# Patient Record
Sex: Male | Born: 1990 | Race: White | Hispanic: No | Marital: Single | Smoking: Current every day smoker
Health system: Southern US, Community
[De-identification: ages and names within clinical notes are randomized; demographics above are authoritative.]

## PROBLEM LIST (undated history)

## (undated) DIAGNOSIS — L409 Psoriasis, unspecified: Secondary | ICD-10-CM

---

## 2012-11-26 ENCOUNTER — Encounter (HOSPITAL_BASED_OUTPATIENT_CLINIC_OR_DEPARTMENT_OTHER): Payer: Self-pay | Admitting: *Deleted

## 2012-11-26 ENCOUNTER — Emergency Department (HOSPITAL_BASED_OUTPATIENT_CLINIC_OR_DEPARTMENT_OTHER)
Admission: EM | Admit: 2012-11-26 | Discharge: 2012-11-27 | Disposition: A | Discharge status: Home / Self Care | Payer: Self-pay | Attending: Emergency Medicine | Admitting: Emergency Medicine

## 2012-11-26 DIAGNOSIS — L299 Pruritus, unspecified: Secondary | ICD-10-CM | POA: Insufficient documentation

## 2012-11-26 DIAGNOSIS — M545 Low back pain, unspecified: Secondary | ICD-10-CM | POA: Insufficient documentation

## 2012-11-26 DIAGNOSIS — R21 Rash and other nonspecific skin eruption: Secondary | ICD-10-CM | POA: Insufficient documentation

## 2012-11-26 DIAGNOSIS — F17200 Nicotine dependence, unspecified, uncomplicated: Secondary | ICD-10-CM | POA: Insufficient documentation

## 2012-11-26 LAB — CBC WITH DIFFERENTIAL/PLATELET
Basophils Absolute: 0 10*3/uL (ref 0.0–0.1)
Basophils Relative: 0 % (ref 0–1)
Eosinophils Absolute: 0.1 10*3/uL (ref 0.0–0.7)
MCH: 32.8 pg (ref 26.0–34.0)
MCHC: 36.9 g/dL — ABNORMAL HIGH (ref 30.0–36.0)
Neutro Abs: 4.4 10*3/uL (ref 1.7–7.7)
Neutrophils Relative %: 67 % (ref 43–77)
Platelets: 186 10*3/uL (ref 150–400)
RDW: 12.4 % (ref 11.5–15.5)

## 2012-11-26 LAB — BASIC METABOLIC PANEL
Chloride: 105 mEq/L (ref 96–112)
GFR calc Af Amer: >90 mL/min (ref >90)
GFR calc non Af Amer: >90 mL/min (ref >90)
Potassium: 3.4 mEq/L — ABNORMAL LOW (ref 3.5–5.1)

## 2012-11-26 MED ORDER — SULFAMETHOXAZOLE-TMP DS 800-160 MG PO TABS
1.0000 | ORAL_TABLET | Freq: Once | ORAL | Status: AC
  Administered 2012-11-26: 1 via ORAL
  Filled 2012-11-26: qty 1

## 2012-11-26 MED ORDER — PREDNISONE 50 MG PO TABS
60.0000 mg | ORAL_TABLET | Freq: Once | ORAL | Status: AC
  Administered 2012-11-26: 60 mg via ORAL
  Filled 2012-11-26: qty 1

## 2012-11-26 MED ORDER — DIPHENHYDRAMINE HCL 25 MG PO CAPS
25.0000 mg | ORAL_CAPSULE | Freq: Once | ORAL | Status: AC
  Administered 2012-11-26: 25 mg via ORAL
  Filled 2012-11-26: qty 1

## 2012-11-26 MED ORDER — CEPHALEXIN 250 MG PO CAPS
250.0000 mg | ORAL_CAPSULE | Freq: Once | ORAL | Status: AC
  Administered 2012-11-26: 250 mg via ORAL
  Filled 2012-11-26: qty 1

## 2012-11-26 NOTE — ED Provider Notes (Signed)
History    CSN: [id number] Arrival date & time 11/26/12  2144  First MD Initiated Contact with Patient 11/26/12 2212     Chief Complaint  Patient presents with  . Rash   (Consider location/radiation/quality/duration/timing/severity/associated sxs/prior Treatment) HPI  22 year old male presents for evaluation of a rash. Patient reports he has developed a rash for the past month. Rash was first noticed on his chest but now has spread throughout the whole entire body. Rash is itchy in nature, and is progressively worse. Tonight he while working he felt short of breath, and then decided to come to ER for further evaluation. Sts SOB lasted for 40 min but has resolved.  He denies fever, headache, neck stiffness, cp, cough, hemoptysis, abd pain, dysuria, numbness or weakness.  Does report having nausea and vomiting usually in the morning for nearly 2 weeks but that has resolved.  Also report having pain to lower back for several weeks but pain is minimal.  He took left over ibuprofen 800mg  and augmentin for the past week as well.  He denies any recent sexual contact.  No one else at home with similar sxs.  No new pets, changes in soap or detergent, no tick bite, no joint pain.  Has tried OTC cream without relief.  Has not been evaluate for this before.    History reviewed. No pertinent past medical history. History reviewed. No pertinent past surgical history. No family history on file. History  Substance Use Topics  . Smoking status: Current Every Day Smoker -- 0.50 packs/day    Types: Cigarettes  . Smokeless tobacco: Not on file  . Alcohol Use: Yes    Review of Systems  All other systems reviewed and are negative.    Allergies  Review of patient's allergies indicates no known allergies.  Home Medications  No current outpatient prescriptions on file. BP 145/83  Pulse 112  Temp(Src) 98.5 F (36.9 C) (Oral)  Resp 22  Wt 160 lb (72.576 kg)  SpO2 100% Physical Exam  Nursing note  and vitals reviewed. Constitutional: He is oriented to person, place, and time. He appears well-developed and well-nourished. No distress.  HENT:  Head: Normocephalic and atraumatic.  Significant plaque-rash throughout scalp, no alopecia.  Eyes: Conjunctivae and EOM are normal. Pupils are equal, round, and reactive to light. No scleral icterus.  Neck: Normal range of motion. Neck supple.  Cardiovascular:  Tachycardia without M/R/G  Pulmonary/Chest: Effort normal and breath sounds normal. No respiratory distress. He has no wheezes. He has no rales.  Abdominal: Soft. Bowel sounds are normal. There is no tenderness.  Neurological: He is alert and oriented to person, place, and time.  Skin: Skin is warm. Rash (blanchable erythematous plaque appearance rash throughout scalp, face, chest, back, arms, legs, and penis.  No rash on palms of hand, soles of foot, or mucosa) noted.  Psychiatric: He has a normal mood and affect.    ED Course  Procedures (including critical care time)  11:00 PM Pt with rash suggestive of psoriasis.  Doubt infectious etiology as other member at home come in contact with rash without same rash.  No red flags.  My attending has also evaluate and agrees.  Plan to refer pt to Dermatology.  Will prescribe keflex/bactrim to prevent secondary infection.  Plan do give benadryl for itch, and prednisone burst/taper.  Return precaution discussed.     Labs Reviewed  CBC WITH DIFFERENTIAL - Abnormal; Notable for the following:    MCHC 36.9 (*)  All other components within normal limits  BASIC METABOLIC PANEL - Abnormal; Notable for the following:    Potassium 3.4 (*)    Glucose, Bld 105 (*)    All other components within normal limits  RPR   No results found. 1. Rash     MDM  BP 145/83  Pulse 112  Temp(Src) 98.5 F (36.9 C) (Oral)  Resp 22  Wt 160 lb (72.576 kg)  SpO2 100%   Fayrene Helper, PA-C 11/27/12 0012

## 2012-11-26 NOTE — ED Notes (Signed)
Rash for a month. Tonight he got sob. Hot to touch per mother.

## 2012-11-26 NOTE — ED Notes (Signed)
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

## 2012-11-27 LAB — RPR: RPR Ser Ql: NONREACTIVE

## 2012-11-27 MED ORDER — PREDNISONE 20 MG PO TABS: ORAL_TABLET | ORAL | Status: AC

## 2012-11-27 MED ORDER — SULFAMETHOXAZOLE-TRIMETHOPRIM 800-160 MG PO TABS: 1.0000 | ORAL_TABLET | Freq: Two times a day (BID) | ORAL | Status: AC

## 2012-11-27 MED ORDER — CEPHALEXIN 500 MG PO CAPS: 500.0000 mg | ORAL_CAPSULE | Freq: Four times a day (QID) | ORAL | Status: AC

## 2012-11-27 MED ORDER — DIPHENHYDRAMINE HCL 25 MG PO TABS: 25.0000 mg | ORAL_TABLET | Freq: Four times a day (QID) | ORAL | Status: AC

## 2012-11-27 NOTE — ED Provider Notes (Signed)
Medical screening examination/treatment/procedure(s) were conducted as a shared visit with non-physician practitioner(s) and myself.  I personally evaluated the patient during the encounter  Pt seen and examined, mulitple erythematous pruritic lesions- pustules? Some with overlying scale.  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  Labs reassuring.  RPR sent as well.  Pt advised to f/u with dermatology- started on abx in case of MRSA.    Ethelda Chick, MD 11/27/12 1520

## 2020-06-30 ENCOUNTER — Emergency Department (HOSPITAL_COMMUNITY): Payer: Self-pay

## 2020-06-30 ENCOUNTER — Emergency Department (HOSPITAL_COMMUNITY)
Admission: EM | Admit: 2020-06-30 | Discharge: 2020-06-30 | Disposition: A | Discharge status: Home / Self Care | Payer: Self-pay | Attending: Emergency Medicine | Admitting: Emergency Medicine

## 2020-06-30 DIAGNOSIS — F17210 Nicotine dependence, cigarettes, uncomplicated: Secondary | ICD-10-CM | POA: Insufficient documentation

## 2020-06-30 DIAGNOSIS — N201 Calculus of ureter: Secondary | ICD-10-CM | POA: Insufficient documentation

## 2020-06-30 LAB — URINALYSIS, ROUTINE W REFLEX MICROSCOPIC
Bilirubin Urine: NEGATIVE
Glucose, UA: NEGATIVE mg/dL
Hgb urine dipstick: NEGATIVE
Ketones, ur: NEGATIVE mg/dL
Leukocytes,Ua: NEGATIVE
Nitrite: NEGATIVE
Protein, ur: NEGATIVE mg/dL
Specific Gravity, Urine: >1.03 — ABNORMAL HIGH (ref 1.005–1.030)
pH: 5.5 (ref 5.0–8.0)

## 2020-06-30 LAB — CBC
HCT: 42.7 % (ref 39.0–52.0)
Hemoglobin: 14.9 g/dL (ref 13.0–17.0)
MCH: 30.9 pg (ref 26.0–34.0)
MCHC: 34.9 g/dL (ref 30.0–36.0)
MCV: 88.6 fL (ref 80.0–100.0)
Platelets: 302 10*3/uL (ref 150–400)
RBC: 4.82 MIL/uL (ref 4.22–5.81)
RDW: 13.8 % (ref 11.5–15.5)
WBC: 6.8 10*3/uL (ref 4.0–10.5)
nRBC: 0 % (ref 0.0–0.2)

## 2020-06-30 LAB — BASIC METABOLIC PANEL
Anion gap: 11 (ref 5–15)
BUN: 9 mg/dL (ref 6–20)
CO2: 22 mmol/L (ref 22–32)
Calcium: 9.2 mg/dL (ref 8.9–10.3)
Chloride: 106 mmol/L (ref 98–111)
Creatinine, Ser: 0.82 mg/dL (ref 0.61–1.24)
GFR, Estimated: >60 mL/min (ref >60)
Glucose, Bld: 93 mg/dL (ref 70–99)
Potassium: 3.9 mmol/L (ref 3.5–5.1)
Sodium: 139 mmol/L (ref 135–145)

## 2020-06-30 MED ORDER — HYDROCODONE-ACETAMINOPHEN 5-325 MG PO TABS: 1.0000 | ORAL_TABLET | Freq: Four times a day (QID) | ORAL | 0 refills | Status: AC | PRN

## 2020-06-30 MED ORDER — ONDANSETRON HCL 4 MG PO TABS: 4.0000 mg | ORAL_TABLET | Freq: Four times a day (QID) | ORAL | 0 refills | Status: AC

## 2020-06-30 MED ORDER — IBUPROFEN 600 MG PO TABS: 600.0000 mg | ORAL_TABLET | Freq: Four times a day (QID) | ORAL | 0 refills | Status: AC | PRN

## 2020-06-30 MED ORDER — OXYCODONE-ACETAMINOPHEN 5-325 MG PO TABS
1.0000 | ORAL_TABLET | ORAL | Status: DC | PRN
  Administered 2020-06-30: 1 via ORAL
  Filled 2020-06-30: qty 1

## 2020-06-30 MED ORDER — TAMSULOSIN HCL 0.4 MG PO CAPS: 0.4000 mg | ORAL_CAPSULE | Freq: Every day | ORAL | 0 refills | Status: AC

## 2020-06-30 NOTE — ED Provider Notes (Signed)
[facility] EMERGENCY DEPARTMENT Provider Note   CSN: [id number] Arrival date & time: 06/30/20  3810     History Chief Complaint  Patient presents with  . Flank Pain    Ruben Klein is a 30 y.o. male.  HPI   30 year old male presenting the emergency department today for evaluation of right flank pain. States it started earlier this morning. Pain was severe in nature and was constant. He reports some associated pressure with urination for the past 1 to 2 days. He denies any dysuria or hematuria. Denies any associated fevers nausea, vomiting, diarrhea or constipation. States he was given medication while in the waiting room and his pain is now about a 1/10.  No past medical history on file.  There are no problems to display for this patient.   No past surgical history on file.     No family history on file.  Social History   Tobacco Use  . Smoking status: Current Every Day Smoker    Packs/day: 0.50    Types: Cigarettes  Substance Use Topics  . Alcohol use: Yes  . Drug use: No    Home Medications Prior to Admission medications   Medication Sig Start Date End Date Taking? Authorizing Provider  HYDROcodone-acetaminophen (NORCO/VICODIN) 5-325 MG tablet Take 1 tablet by mouth every 6 (six) hours as needed. 06/30/20  Yes Farouk Vivero S, PA-C  ibuprofen (ADVIL) 600 MG tablet Take 1 tablet (600 mg total) by mouth every 6 (six) hours as needed. 06/30/20  Yes Jenelle Drennon S, PA-C  ondansetron (ZOFRAN) 4 MG tablet Take 1 tablet (4 mg total) by mouth every 6 (six) hours. 06/30/20  Yes Vika Buske S, PA-C  tamsulosin (FLOMAX) 0.4 MG CAPS capsule Take 1 capsule (0.4 mg total) by mouth daily. 06/30/20 07/30/20 Yes Bristol Osentoski S, PA-C  cephALEXin (KEFLEX) 500 MG capsule Take 1 capsule (500 mg total) by mouth 4 (four) times daily. 11/27/12   Fayrene Helper, PA-C  diphenhydrAMINE (BENADRYL) 25 MG tablet Take 1 tablet (25 mg total) by mouth every 6 (six) hours. 11/27/12    Fayrene Helper, PA-C  predniSONE (DELTASONE) 20 MG tablet 3 Tabs PO Days 1-3, then 2 tabs PO Days 4-6, then 1 tab PO Day 7-9, then Half Tab PO Day 10-12 11/27/12   Fayrene Helper, PA-C  sulfamethoxazole-trimethoprim (SEPTRA DS) 800-160 MG per tablet Take 1 tablet by mouth every 12 (twelve) hours. 11/27/12   Fayrene Helper, PA-C    Allergies    Patient has no known allergies.  Review of Systems   Review of Systems  Constitutional: Negative for fever.  HENT: Negative for ear pain and sore throat.   Eyes: Negative for visual disturbance.  Respiratory: Negative for cough and shortness of breath.   Cardiovascular: Negative for chest pain.  Gastrointestinal: Negative for abdominal pain, constipation, diarrhea, nausea and vomiting.  Genitourinary: Positive for flank pain. Negative for dysuria, hematuria and urgency.       Pressure with urination  Musculoskeletal: Negative for back pain.  Skin: Negative for rash.  Neurological: Negative for seizures and syncope.  All other systems reviewed and are negative.   Physical Exam Updated Vital Signs BP (!) 139/92 (BP Location: Right Arm)   Pulse 76   Temp 98.4 F (36.9 C) (Oral)   Resp 18   SpO2 99%   Physical Exam Vitals and nursing note reviewed.  Constitutional:      Appearance: He is well-developed and well-nourished.  HENT:     Head:  Normocephalic and atraumatic.  Eyes:     Conjunctiva/sclera: Conjunctivae normal.  Cardiovascular:     Rate and Rhythm: Normal rate and regular rhythm.     Heart sounds: Normal heart sounds. No murmur heard.   Pulmonary:     Effort: Pulmonary effort is normal. No respiratory distress.     Breath sounds: Normal breath sounds.  Abdominal:     General: Bowel sounds are normal.     Palpations: Abdomen is soft.     Tenderness: There is no abdominal tenderness. There is right CVA tenderness (mild). There is no left CVA tenderness, guarding or rebound.  Musculoskeletal:        General: No edema.     Cervical  back: Neck supple.  Skin:    General: Skin is warm and dry.  Neurological:     Mental Status: He is alert.  Psychiatric:        Mood and Affect: Mood and affect normal.     ED Results / Procedures / Treatments   Labs (all labs ordered are listed, but only abnormal results are displayed) Labs Reviewed  URINALYSIS, ROUTINE W REFLEX MICROSCOPIC - Abnormal; Notable for the following components:      Result Value   Specific Gravity, Urine >1.030 (*)    All other components within normal limits  BASIC METABOLIC PANEL  CBC    EKG None  Radiology CT Renal Stone Study  Result Date: 06/30/2020 CLINICAL DATA:  Right-sided abdominal and flank pain since this morning EXAM: CT ABDOMEN AND PELVIS WITHOUT CONTRAST TECHNIQUE: Multidetector CT imaging of the abdomen and pelvis was performed following the standard protocol without IV contrast. COMPARISON:  None. FINDINGS: Lower chest: Unremarkable Hepatobiliary: Unremarkable Pancreas: Unremarkable Spleen: Unremarkable Adrenals/Urinary Tract: Both adrenal glands appear normal. 2 mm right UVJ stone noted on image 80 of series 3, without significant hydronephrosis or hydroureter at this time. No other urinary tract calculi are identified. Stomach/Bowel: . unremarkable.  Normal appendix. Vascular/Lymphatic: There scattered small retroperitoneal and inguinal lymph nodes which are not pathologically enlarged in size although are mildly notable in number. Reproductive: Unremarkable Other: No supplemental non-categorized findings. Musculoskeletal: Unremarkable IMPRESSION: 1. 2 mm right UVJ stone without significant hydronephrosis or hydroureter at this time. No other urinary tract calculi are identified. 2. Normal appendix. 3. Scattered small retroperitoneal and inguinal lymph nodes are not pathologically enlarged in size although are mildly notable in number-possibly reactive. Electronically Signed   By: Gaylyn Rong M.D.   On: 06/30/2020 13:43     Procedures Procedures   Medications Ordered in ED Medications  oxyCODONE-acetaminophen (PERCOCET/ROXICET) 5-325 MG per tablet 1 tablet (1 tablet Oral Given 06/30/20 1048)    ED Course  I have reviewed the triage vital signs and the nursing notes.  Pertinent labs & imaging results that were available during my care of the patient were reviewed by me and considered in my medical decision making (see chart for details).    MDM Rules/Calculators/A&P                          30 y/o M presenting with right flank pain and urinary pressure   Reviewed/interpreted labs CBC, BMP unremarkable UA w/o hematuria or signs of UTI  Reviewed/interpreted imaging -  CT renal shows -  1. 2 mm right UVJ stone without significant hydronephrosis or hydroureter at this time. No other urinary tract calculi are identified. 2. Normal appendix. 3. Scattered small retroperitoneal and inguinal lymph nodes  are not pathologically enlarged in size although are mildly notable in number-possibly reactive.  Pt here with flank pain. Pain well controlled with one dose of analgesics. Ct notable for ureteral stone which appears small enough to pass without intervention. rx for flomax, antiemetics, and pain meds given. Advised to f/u with urology. Advised to RTER for new or worsening symptoms. He voices understanding of the plan and reasons to return. All questions answered, pt stable for discharge.    Final Clinical Impression(s) / ED Diagnoses Final diagnoses:  Ureterolithiasis    Rx / DC Orders ED Discharge Orders         Ordered    HYDROcodone-acetaminophen (NORCO/VICODIN) 5-325 MG tablet  Every 6 hours PRN        06/30/20 1357    tamsulosin (FLOMAX) 0.4 MG CAPS capsule  Daily        06/30/20 1357    ibuprofen (ADVIL) 600 MG tablet  Every 6 hours PRN        06/30/20 1357    ondansetron (ZOFRAN) 4 MG tablet  Every 6 hours        06/30/20 1357           Caytlyn Evers S, PA-C 06/30/20 1358     Gerhard Munch, MD 06/30/20 [phone]

## 2020-06-30 NOTE — ED Notes (Signed)
Patient verbalizes understanding of discharge instructions. Opportunity for questioning and answers were provided. Armband removed by staff, pt discharged from ED.  

## 2020-06-30 NOTE — ED Triage Notes (Signed)
Pt arrives to ED with right flank pain in the last 2 hours 8/10.

## 2020-06-30 NOTE — ED Notes (Signed)
Patient remains at CT

## 2020-06-30 NOTE — Discharge Instructions (Addendum)
Today you were diagnosed with a kidney stone on your CT scan.  You will be given a prescription for Flomax, pain medication, and nausea medication.  You should not drive, work, or operate machinery while taking the pain medication as it can make you very drowsy.  You will need to follow-up with urology for reevaluation and for further treatment of your kidney stone.  You will need to return to the emergency department for any fevers, persistent pain, persistent vomiting, inability to urinate, or any new or worsening symptoms.  

## 2021-09-08 IMAGING — CT CT RENAL STONE PROTOCOL
2 of 4 series · 17 of 46 positions shown, 19 images · non-contrast
Comparison: None.

CLINICAL DATA: Right-sided abdominal and flank pain since this
morning

EXAM:
CT ABDOMEN AND PELVIS WITHOUT CONTRAST
TECHNIQUE: Multidetector CT imaging of the abdomen and pelvis was performed
following the standard protocol without IV contrast.

[Series 3: renal stone 5.0 · axial · 0.91mm/px · z∈[+971,+1446]mm · 14 of 105 slices shown, 16 images]
[im 5/105  soft-tissue]
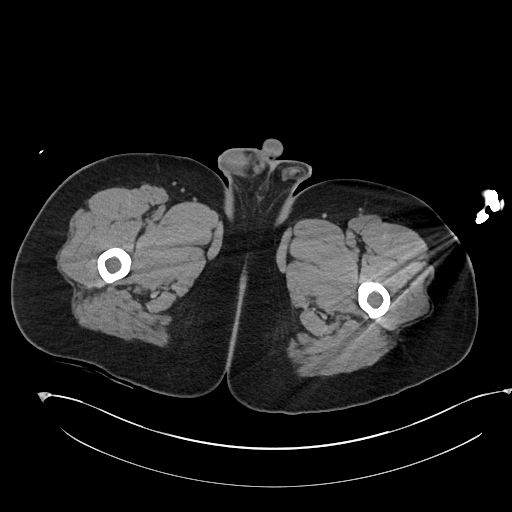
[im 5/105  bone]
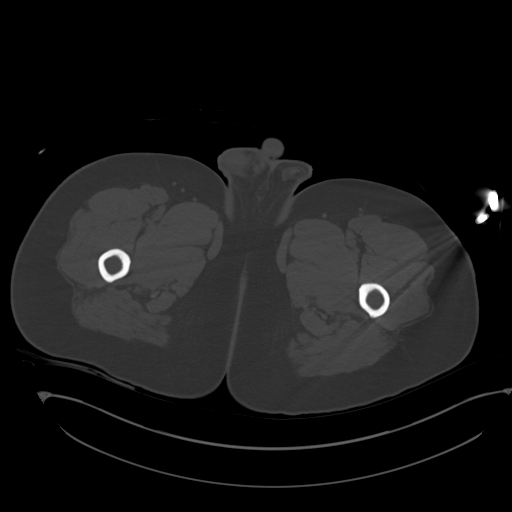
[im 14/105  soft-tissue]
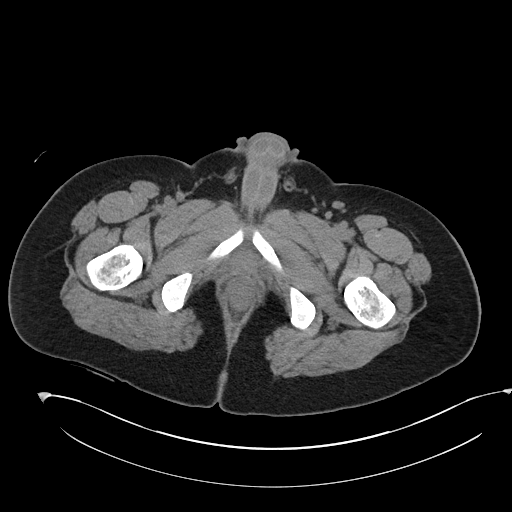
[im 22/105  soft-tissue]
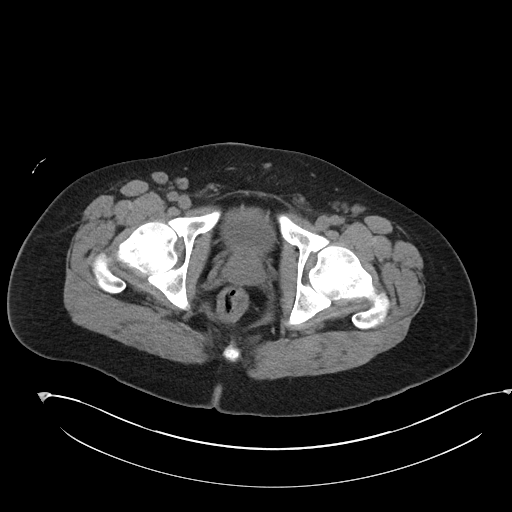
[im 27/105  soft-tissue]
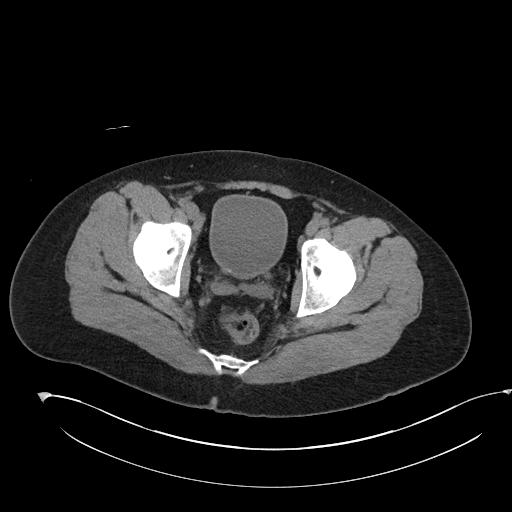
[im 35/105  soft-tissue]
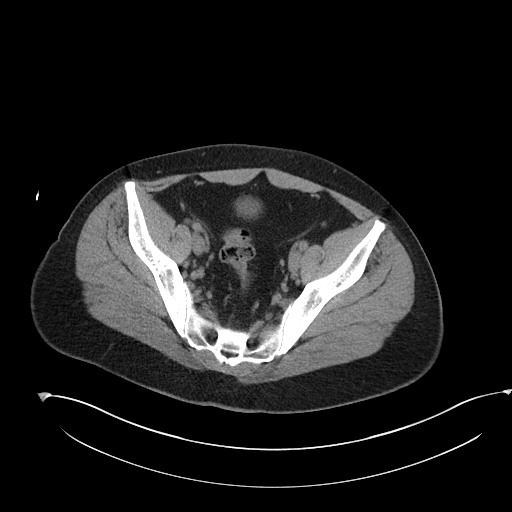
[im 44/105  soft-tissue]
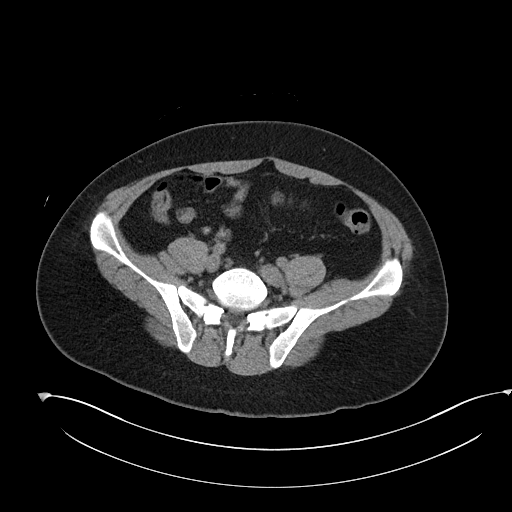
[im 48/105  soft-tissue]
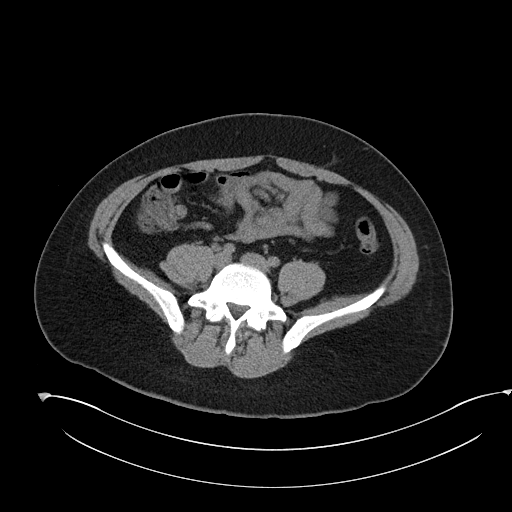
[im 57/105  soft-tissue]
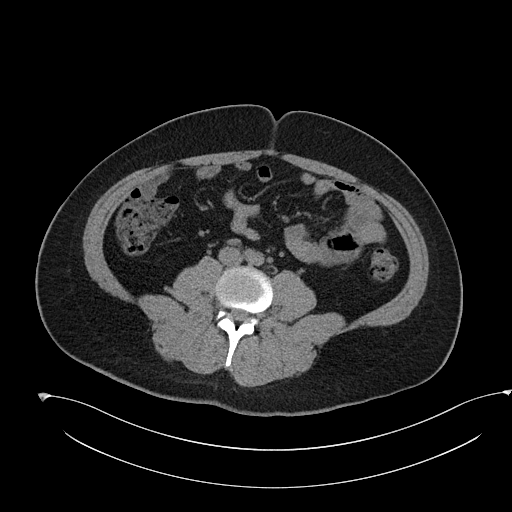
[im 61/105  soft-tissue]
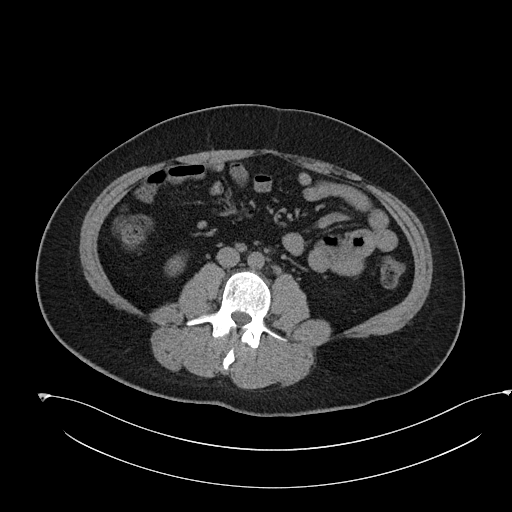
[im 61/105  bone]
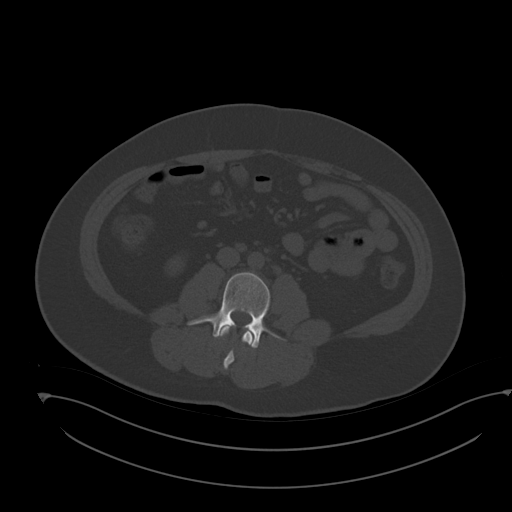
[im 70/105  soft-tissue]
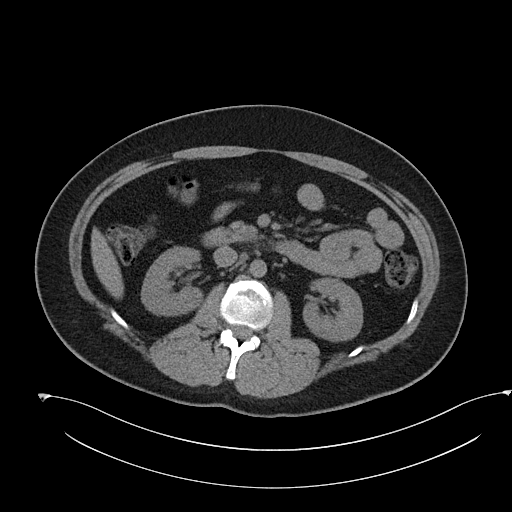
[im 79/105  soft-tissue]
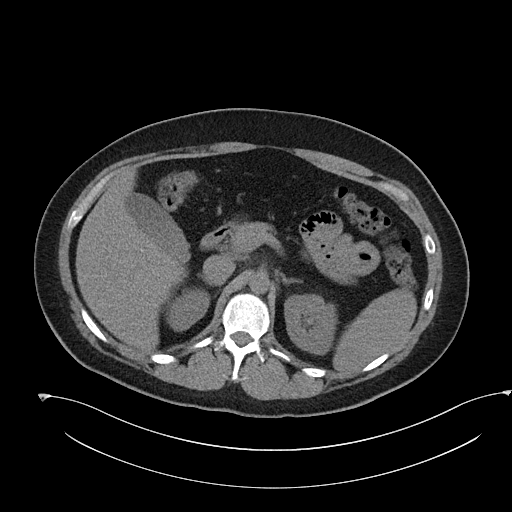
[im 83/105  soft-tissue]
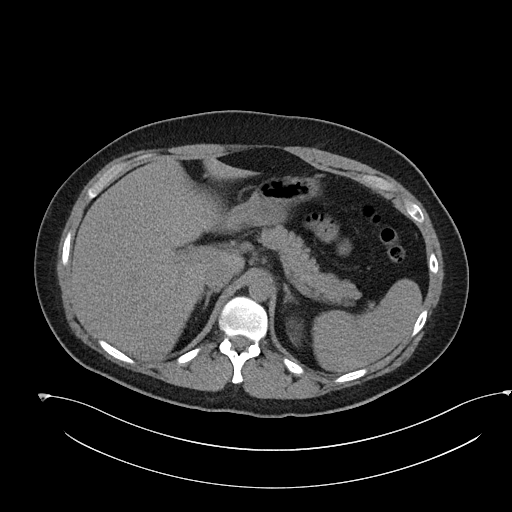
[im 92/105  soft-tissue]
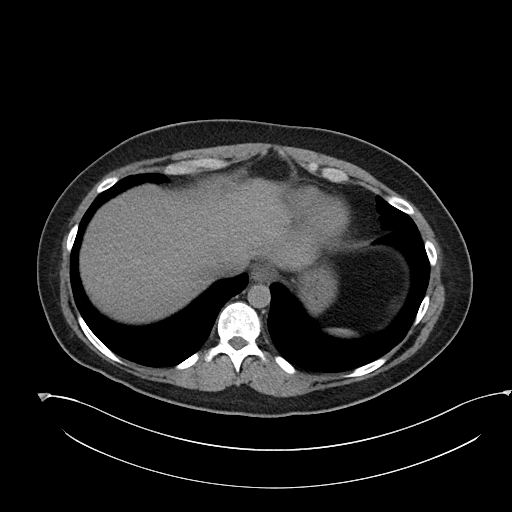
[im 100/105  soft-tissue]
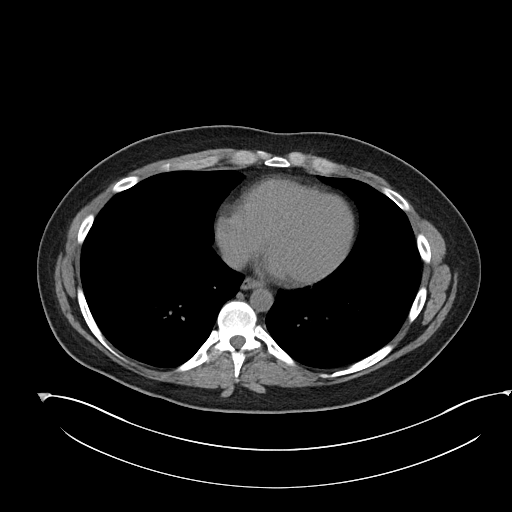

[Series 6: coronal · coronal · 0.98mm/px · 3 of 94 slices shown]
[im 32/94  soft-tissue]
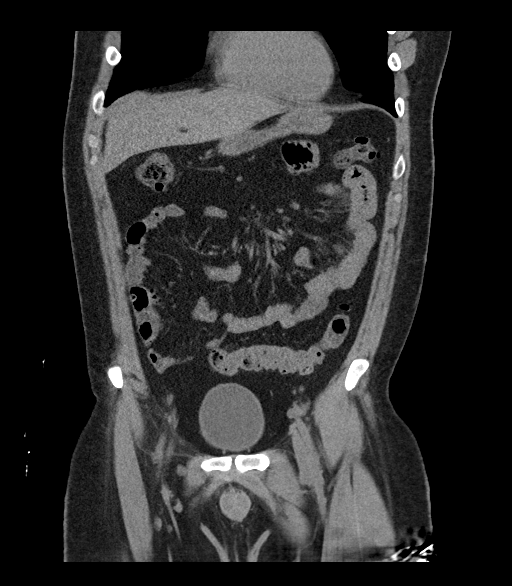
[im 42/94  soft-tissue]
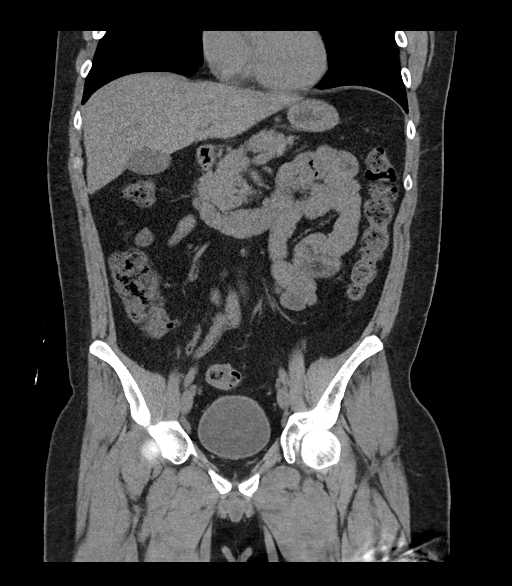
[im 52/94  soft-tissue]
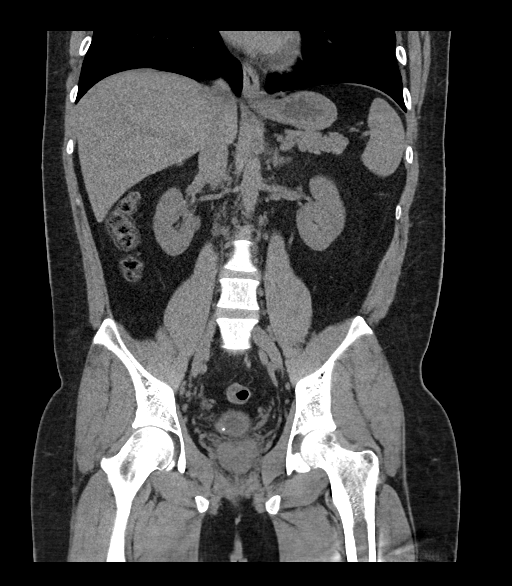

[17 of 46 positions shown; findings below may reference images not displayed]

FINDINGS: Lower chest: Unremarkable

Hepatobiliary: Unremarkable

Pancreas: Unremarkable

Spleen: Unremarkable

Adrenals/Urinary Tract: Both adrenal glands appear normal.

2 mm right UVJ stone noted on image 80 of series 3, without
significant hydronephrosis or hydroureter at this time. No other
urinary tract calculi are identified.

Stomach/Bowel: . unremarkable.  Normal appendix.

Vascular/Lymphatic: There scattered small retroperitoneal and
inguinal lymph nodes which are not pathologically enlarged in size
although are mildly notable in number.

Reproductive: Unremarkable

Other: No supplemental non-categorized findings.

Musculoskeletal: Unremarkable
IMPRESSION: 1. 2 mm right UVJ stone without significant hydronephrosis or
hydroureter at this time. No other urinary tract calculi are
identified.
2. Normal appendix.
3. Scattered small retroperitoneal and inguinal lymph nodes are not
pathologically enlarged in size although are mildly notable in
number-possibly reactive.

## 2021-12-20 ENCOUNTER — Other Ambulatory Visit: Payer: Self-pay

## 2021-12-20 ENCOUNTER — Emergency Department (HOSPITAL_COMMUNITY)
Admission: EM | Admit: 2021-12-20 | Discharge: 2021-12-20 | Discharge status: Against Medical Advice | Payer: Medicaid Other | Attending: Student | Admitting: Student

## 2021-12-20 ENCOUNTER — Encounter (HOSPITAL_COMMUNITY): Payer: Self-pay | Admitting: Emergency Medicine

## 2021-12-20 ENCOUNTER — Emergency Department (HOSPITAL_COMMUNITY): Payer: Medicaid Other

## 2021-12-20 DIAGNOSIS — Z5321 Procedure and treatment not carried out due to patient leaving prior to being seen by health care provider: Secondary | ICD-10-CM | POA: Insufficient documentation

## 2021-12-20 DIAGNOSIS — R0602 Shortness of breath: Secondary | ICD-10-CM | POA: Insufficient documentation

## 2021-12-20 DIAGNOSIS — R202 Paresthesia of skin: Secondary | ICD-10-CM | POA: Insufficient documentation

## 2021-12-20 HISTORY — DX: Psoriasis, unspecified: L40.9

## 2021-12-20 LAB — CBC WITH DIFFERENTIAL/PLATELET
Abs Immature Granulocytes: 0.02 10*3/uL (ref 0.00–0.07)
Basophils Absolute: 0.1 10*3/uL (ref 0.0–0.1)
Basophils Relative: 1 %
Eosinophils Absolute: 0.3 10*3/uL (ref 0.0–0.5)
Eosinophils Relative: 4 %
HCT: 41.3 % (ref 39.0–52.0)
Hemoglobin: 14.1 g/dL (ref 13.0–17.0)
Immature Granulocytes: 0 %
Lymphocytes Relative: 23 %
Lymphs Abs: 1.5 10*3/uL (ref 0.7–4.0)
MCH: 31.1 pg (ref 26.0–34.0)
MCHC: 34.1 g/dL (ref 30.0–36.0)
MCV: 91 fL (ref 80.0–100.0)
Monocytes Absolute: 0.5 10*3/uL (ref 0.1–1.0)
Monocytes Relative: 7 %
Neutro Abs: 4.2 10*3/uL (ref 1.7–7.7)
Neutrophils Relative %: 65 %
Platelets: 290 10*3/uL (ref 150–400)
RBC: 4.54 MIL/uL (ref 4.22–5.81)
RDW: 13.6 % (ref 11.5–15.5)
WBC: 6.5 10*3/uL (ref 4.0–10.5)
nRBC: 0 % (ref 0.0–0.2)

## 2021-12-20 LAB — BASIC METABOLIC PANEL
Anion gap: 8 (ref 5–15)
BUN: 5 mg/dL — ABNORMAL LOW (ref 6–20)
CO2: 22 mmol/L (ref 22–32)
Calcium: 8.9 mg/dL (ref 8.9–10.3)
Chloride: 106 mmol/L (ref 98–111)
Creatinine, Ser: 0.76 mg/dL (ref 0.61–1.24)
GFR, Estimated: >60 mL/min (ref >60)
Glucose, Bld: 95 mg/dL (ref 70–99)
Potassium: 3.5 mmol/L (ref 3.5–5.1)
Sodium: 136 mmol/L (ref 135–145)

## 2021-12-20 LAB — D-DIMER, QUANTITATIVE: D-Dimer, Quant: 0.27 ug/mL-FEU (ref 0.00–0.50)

## 2021-12-20 NOTE — ED Notes (Signed)
Per the Pt they do not want to wait any longer pt stated that he is going home.

## 2021-12-20 NOTE — ED Provider Triage Note (Signed)
Emergency Medicine Provider Triage Evaluation Note  Ruben Klein , a 31 y.o. male  was evaluated in triage.  Pt complains of shortness of breath since last night.  States that he was laying down to go to sleep and started to feel short of breath and felt like he was suffocating in bed.  He endorses not being able to sleep much throughout the night and only getting a few hours of sleep.  He states that he has had similar feelings today and is currently complaining of tingling in his fingers and in his face.  He denies chest pain, headache, nausea, abdominal pain.  He denies history of any similar complaints and denies history of anxiety.  Review of Systems  Positive: Shortness of breath Negative: Chest pain, abdominal pain  Physical Exam  BP 129/90 (BP Location: Right Arm)   Pulse 84   Temp 98.3 F (36.8 C) (Oral)   Resp 16   SpO2 99%  Gen:   Awake, no distress   Resp:  Normal effort  MSK:   Moves extremities without difficulty  Other:    Medical Decision Making  Medically screening exam initiated at 11:46 AM.  Appropriate orders placed.  Nasier Thumm was informed that the remainder of the evaluation will be completed by another provider, this initial triage assessment does not replace that evaluation, and the importance of remaining in the ED until their evaluation is complete.     Darrick Grinder, PA-C 12/20/21 1147

## 2021-12-20 NOTE — ED Triage Notes (Signed)
Pt started feeling sob last night. Pt went to work and continued to feel sob, fingers tingly and face feels tingly. Pt also states he had some palpitations last night. Pt states he felt like he was going to suffocate last night. Pt has some tightness in his chest that started with the sob.
